# Patient Record
Sex: Female | Born: 1935 | Race: White | Hispanic: No | Marital: Married | Smoking: Never smoker
Health system: Southern US, Community
[De-identification: ages and names within clinical notes are randomized; demographics above are authoritative.]

## PROBLEM LIST (undated history)

## (undated) DIAGNOSIS — I639 Cerebral infarction, unspecified: Secondary | ICD-10-CM

## (undated) DIAGNOSIS — E785 Hyperlipidemia, unspecified: Secondary | ICD-10-CM

## (undated) DIAGNOSIS — O2230 Deep phlebothrombosis in pregnancy, unspecified trimester: Secondary | ICD-10-CM

## (undated) DIAGNOSIS — I10 Essential (primary) hypertension: Secondary | ICD-10-CM

## (undated) DIAGNOSIS — D6851 Activated protein C resistance: Secondary | ICD-10-CM

## (undated) HISTORY — PX: MENINGOCELE REPAIR: SHX712

## (undated) HISTORY — PX: MEDIAL PARTIAL KNEE REPLACEMENT: SHX5965

## (undated) HISTORY — PX: TUBAL LIGATION: SHX77

## (undated) HISTORY — PX: KNEE ARTHROSCOPY: SUR90

## (undated) HISTORY — PX: TONSILLECTOMY: SUR1361

## (undated) HISTORY — PX: APPENDECTOMY: SHX54

---

## 2018-08-13 ENCOUNTER — Encounter (HOSPITAL_BASED_OUTPATIENT_CLINIC_OR_DEPARTMENT_OTHER): Payer: Self-pay | Admitting: *Deleted

## 2018-08-13 ENCOUNTER — Other Ambulatory Visit: Payer: Self-pay

## 2018-08-13 ENCOUNTER — Emergency Department (HOSPITAL_BASED_OUTPATIENT_CLINIC_OR_DEPARTMENT_OTHER): Payer: Medicare Other

## 2018-08-13 ENCOUNTER — Emergency Department (HOSPITAL_BASED_OUTPATIENT_CLINIC_OR_DEPARTMENT_OTHER)
Admission: EM | Admit: 2018-08-13 | Discharge: 2018-08-13 | Disposition: A | Discharge status: Home / Self Care | Payer: Medicare Other | Attending: Emergency Medicine | Admitting: Emergency Medicine

## 2018-08-13 DIAGNOSIS — I10 Essential (primary) hypertension: Secondary | ICD-10-CM | POA: Insufficient documentation

## 2018-08-13 DIAGNOSIS — R200 Anesthesia of skin: Secondary | ICD-10-CM | POA: Diagnosis not present

## 2018-08-13 DIAGNOSIS — Z79899 Other long term (current) drug therapy: Secondary | ICD-10-CM | POA: Insufficient documentation

## 2018-08-13 HISTORY — DX: Deep phlebothrombosis in pregnancy, unspecified trimester: O22.30

## 2018-08-13 HISTORY — DX: Essential (primary) hypertension: I10

## 2018-08-13 HISTORY — DX: Hyperlipidemia, unspecified: E78.5

## 2018-08-13 HISTORY — DX: Cerebral infarction, unspecified: I63.9

## 2018-08-13 HISTORY — DX: Activated protein C resistance: D68.51

## 2018-08-13 LAB — URINALYSIS, ROUTINE W REFLEX MICROSCOPIC
Bilirubin Urine: NEGATIVE
Glucose, UA: NEGATIVE mg/dL
Hgb urine dipstick: NEGATIVE
Ketones, ur: NEGATIVE mg/dL
Leukocytes,Ua: NEGATIVE
Nitrite: NEGATIVE
Protein, ur: NEGATIVE mg/dL
Specific Gravity, Urine: 1.01 (ref 1.005–1.030)
pH: 6 (ref 5.0–8.0)

## 2018-08-13 LAB — CBC
HCT: 41.2 % (ref 36.0–46.0)
Hemoglobin: 13.1 g/dL (ref 12.0–15.0)
MCH: 29.7 pg (ref 26.0–34.0)
MCHC: 31.8 g/dL (ref 30.0–36.0)
MCV: 93.4 fL (ref 80.0–100.0)
Platelets: 255 10*3/uL (ref 150–400)
RBC: 4.41 MIL/uL (ref 3.87–5.11)
RDW: 13.1 % (ref 11.5–15.5)
WBC: 10 10*3/uL (ref 4.0–10.5)
nRBC: 0 % (ref 0.0–0.2)

## 2018-08-13 LAB — BASIC METABOLIC PANEL
Anion gap: 10 (ref 5–15)
BUN: 16 mg/dL (ref 8–23)
CO2: 24 mmol/L (ref 22–32)
Calcium: 9.7 mg/dL (ref 8.9–10.3)
Chloride: 104 mmol/L (ref 98–111)
Creatinine, Ser: 0.67 mg/dL (ref 0.44–1.00)
GFR calc Af Amer: >60 mL/min (ref >60)
GFR calc non Af Amer: >60 mL/min (ref >60)
Glucose, Bld: 112 mg/dL — ABNORMAL HIGH (ref 70–99)
Potassium: 4 mmol/L (ref 3.5–5.1)
Sodium: 138 mmol/L (ref 135–145)

## 2018-08-13 NOTE — ED Provider Notes (Signed)
TIME SEEN: 12:42 AM  CHIEF COMPLAINT: Hypertension  HPI: Patient is an 83 year old female with history of hypertension, hyperlipidemia, [facility], factor V Leiden mutation with previous DVT no longer on Coumadin who presents to the emergency department with complaints of high blood pressure.  He states that she felt "shaky".  She and her husband live in a independent living facility and they called the on-call nurse to come check her blood pressure.  They report it was in the 180s/100s.  She took her Norvasc 5 mg tonight.  Nurse recommended she come to the emergency department.  She states that she "felt aware" of the left side of her head today.  She does not describe this as a headache.  No head injury.  No vision changes.  No chest pain, shortness of breath, cough, fever.  Did have some left-sided deficits in her leg with her recent stroke.  She feels like the numbness has gotten slightly worse since discharge but no acute change today.  No other new numbness, weakness.  Able to ambulate.  ROS: See HPI Constitutional: no fever  Eyes: no drainage  ENT: no runny nose   Cardiovascular:  no chest pain  Resp: no SOB  GI: no vomiting GU: no dysuria Integumentary: no rash  Allergy: no hives  Musculoskeletal: no leg swelling  Neurological: no slurred speech ROS otherwise negative  PAST MEDICAL HISTORY/PAST SURGICAL HISTORY:  Past Medical History:  Diagnosis Date  . DVT (deep vein thrombosis) in pregnancy   . Factor V Leiden (HCC)   . Hyperlipidemia   . Hypertension   . Stroke [facility](HCC)     MEDICATIONS:  Prior to Admission medications   Medication Sig Start Date End Date Taking? Authorizing Provider  amLODipine (NORVASC) 5 MG tablet Take 5 mg by mouth daily.   Yes [provider]  atorvastatin (LIPITOR) 40 MG tablet Take 40 mg by mouth daily.   Yes [provider]    ALLERGIES:  No Known Allergies  SOCIAL HISTORY:  Social  History   Tobacco Use  . Smoking status: Never Smoker  . Smokeless tobacco: Never Used  Substance Use Topics  . Alcohol use: Not Currently    FAMILY HISTORY: History reviewed. No pertinent family history.  EXAM: BP (!) 156/92   Pulse 89   Temp 98.5 F (36.9 C) (Oral)   Resp 18   Ht 5' (1.524 m)   Wt 72.6 kg   SpO2 98%   BMI 31.25 kg/m  CONSTITUTIONAL: Alert and oriented and responds appropriately to questions. Well-appearing; well-nourished HEAD: Normocephalic EYES: Conjunctivae clear, pupils appear equal, EOMI ENT: normal nose; moist mucous membranes NECK: Supple, no meningismus, no nuchal rigidity, no LAD  CARD: RRR; S1 and S2 appreciated; no murmurs, no clicks, no rubs, no gallops RESP: Normal chest excursion without splinting or tachypnea; breath sounds clear and equal bilaterally; no wheezes, no rhonchi, no rales, no hypoxia or respiratory distress, speaking full sentences ABD/GI: Normal bowel sounds; non-distended; soft, non-tender, no rebound, no guarding, no peritoneal signs, no hepatosplenomegaly BACK:  The back appears normal and is non-tender to palpation, there is no CVA tenderness EXT: Normal ROM in all joints; non-tender to palpation; no edema; normal capillary refill; no cyanosis, no calf tenderness or swelling    SKIN: Normal color for age and race; warm; no rash NEURO: Moves all extremities equally, strength 5/5 in all 4 extremities, cranial nerves II through XII intact, normal speech, sensation to light touch  intact diffusely, no dysmetria to finger-to-nose testing bilaterally PSYCH: The patient's mood and manner are appropriate. Grooming and personal hygiene are appropriate.  MEDICAL DECISION MAKING: Patient here with complaints of high blood pressure and feeling abnormal in the left side of her head.  She denies that it was a pain.  She does report since discharge from the hospital on July 19 she feels like her left leg numbness has gotten worse but her neuro  exam today is normal and she feels normal sensation in all extremities that is equal.  No new focal neurologic deficit today.  Blood pressure currently in the 150s/80s.  Will check basic labs, urine today.  EKG shows no ischemic abnormality, arrhythmia.  Have offered head CT to look for any worsening intracranial hemorrhage given her complaints of feeling a uncomfortable feeling in the left side of her head today.  Patient and husband agree on head imaging.  I do not feel she needs MRI today if head CT shows no new acute abnormality.  ED PROGRESS: Patient's labs, urine and head CT show no acute abnormality.  She is in stable condition without complaints currently.  Blood pressure in the 130s/60s.  Have advised her to follow-up with her primary care doctor.  Has been has requested something for anxiety but I have recommended follow-up with PCP as we have discussed many medications have multiple side effects especially in elderly populations and will need to be monitored closely by her doctor.  Patient and family are comfortable with this plan.  Will discharge home.   At this time, I do not feel there is any life-threatening condition present. I have reviewed and discussed all results (EKG, imaging, lab, urine as appropriate) and exam findings with patient/family. I have reviewed nursing notes and appropriate previous records.  I feel the patient is safe to be discharged home without further emergent workup and can continue workup as an outpatient as needed. Discussed usual and customary return precautions. Patient/family verbalize understanding and are comfortable with this plan.  Outpatient follow-up has been provided as needed. All questions have been answered.      EKG Interpretation  Date/Time:  Tuesday August 13 2018 00:44:50 EDT Ventricular Rate:  84 PR Interval:    QRS Duration: 94 QT Interval:  379 QTC Calculation: 448 R Axis:   28 Text Interpretation:  Sinus rhythm No old tracing to  compare Confirmed by Ward, Cyril Mourning [phone]) on 08/13/2018 1:16:55 AM          Ward, Delice Bison, DO 08/13/18 0225

## 2018-08-13 NOTE — Discharge Instructions (Addendum)
Please continue your amlodipine as prescribed.  I recommend close follow-up with your primary care doctor.  Your labs, urine, EKG were normal today.  Your head CT showed no acute stroke or acute bleed today.

## 2018-08-13 NOTE — ED Notes (Signed)
Patient transported to CT 

## 2018-08-13 NOTE — ED Triage Notes (Signed)
Pt c/o " anxious and shaky" x 4 hrs increased bp 182/100

## 2020-08-03 IMAGING — CT CT HEAD WITHOUT CONTRAST
3 series · 14 of 47 positions shown, 16 images · non-contrast
Comparison: None.

CLINICAL DATA: Headache, left-sided.  Recent hemorrhagic stroke.

EXAM:
CT HEAD WITHOUT CONTRAST
TECHNIQUE: Contiguous axial images were obtained from the base of the skull
through the vertex without intravenous contrast.

[Series 2: head wo · axial · 0.41mm/px · z∈[-187,-62]mm · 8 of 31 slices shown, 10 images]
[im 3/31  brain]
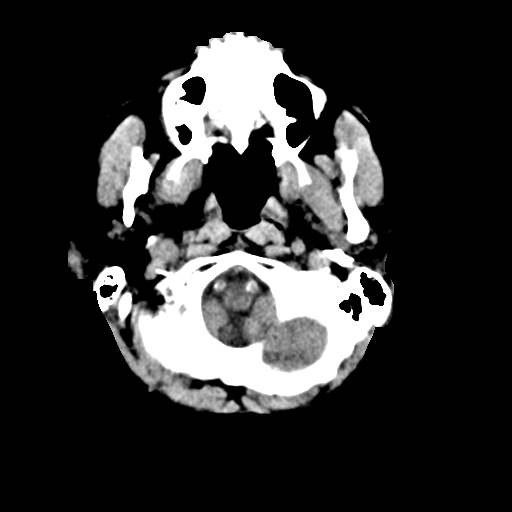
[im 3/31  bone]
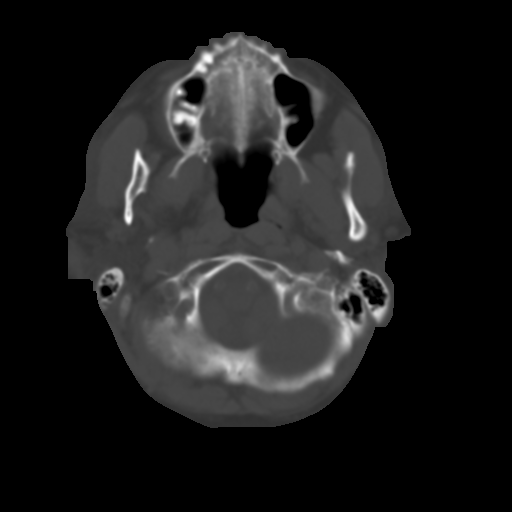
[im 7/31  brain]
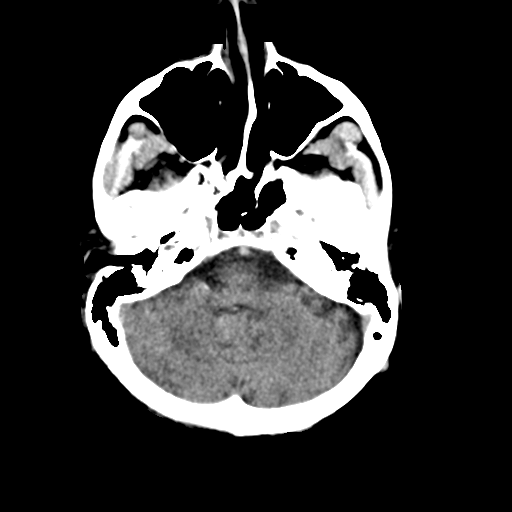
[im 10/31  brain]
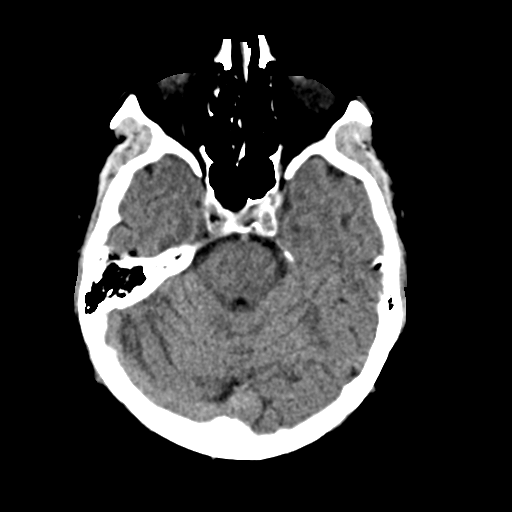
[im 14/31  brain]
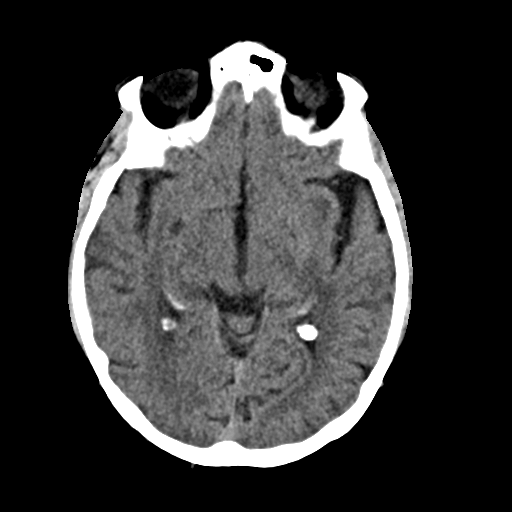
[im 17/31  brain]
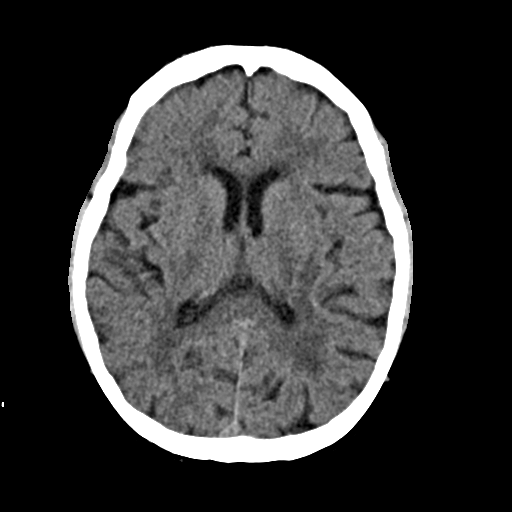
[im 17/31  bone]
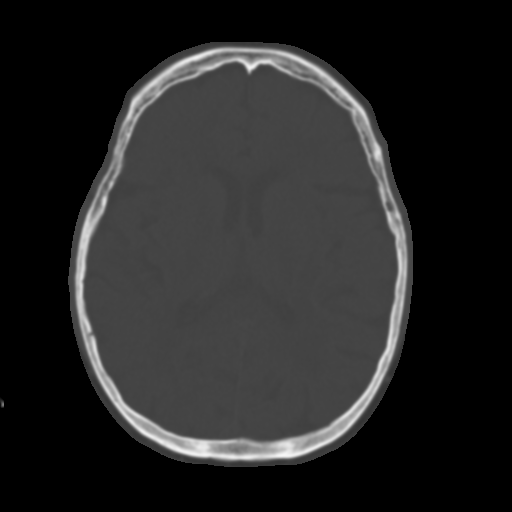
[im 21/31  brain]
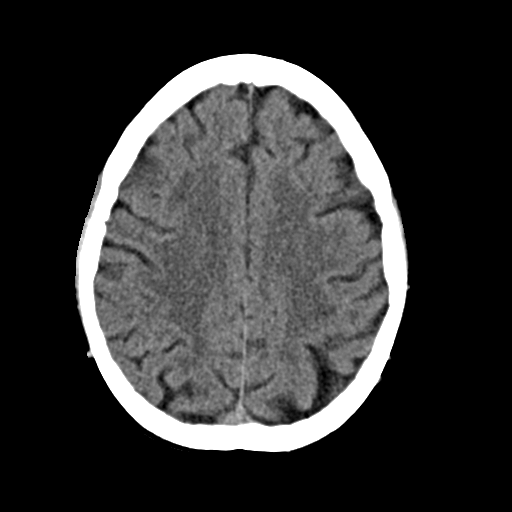
[im 24/31  brain]
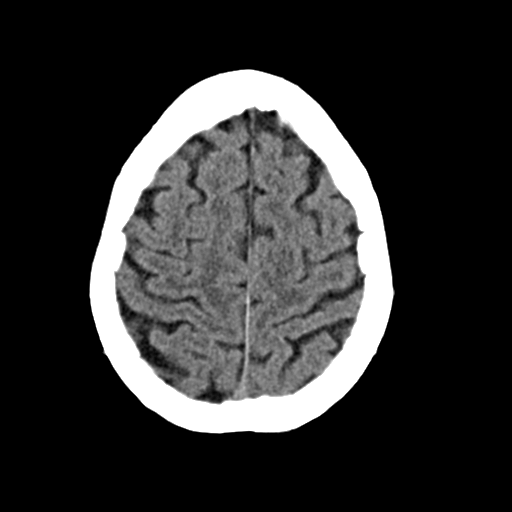
[im 28/31  brain]
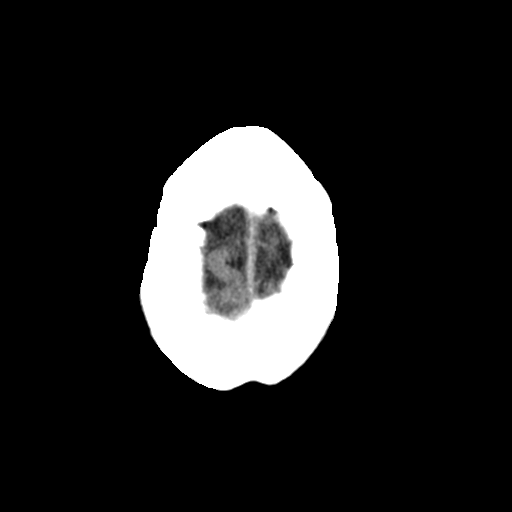

[Series 4: coronal soft · coronal · 0.32mm/px · 3 of 63 slices shown]
[im 21/63  brain]
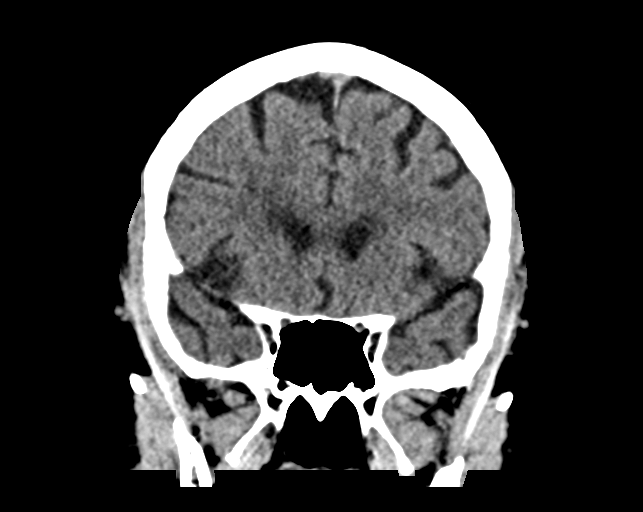
[im 28/63  brain]
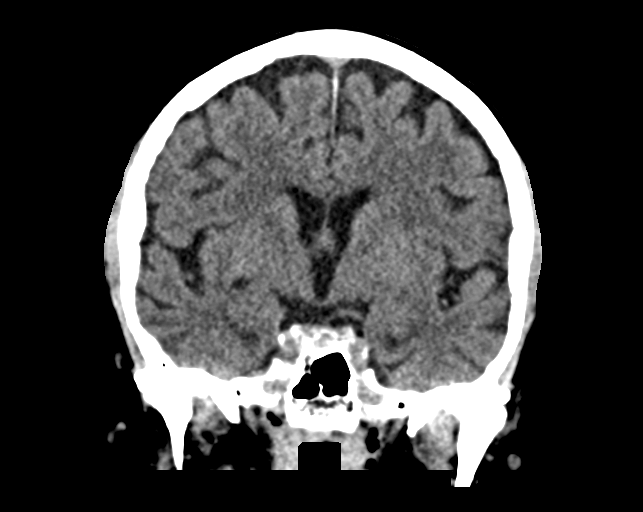
[im 35/63  brain]
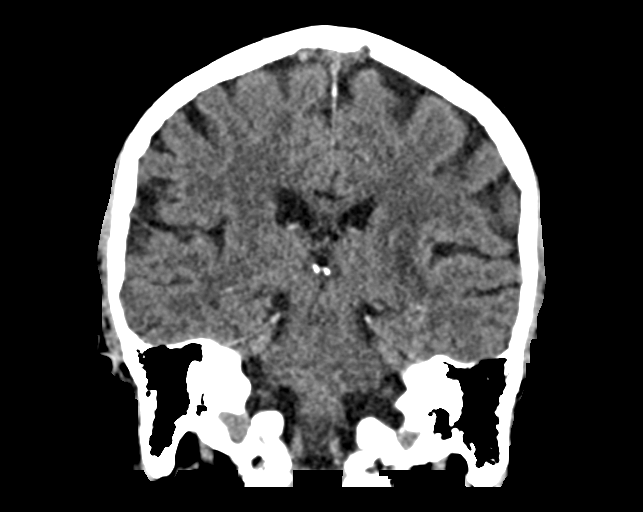

[Series 5: sag soft · sagittal · 0.31mm/px · 3 of 54 slices shown]
[im 18/54  brain]
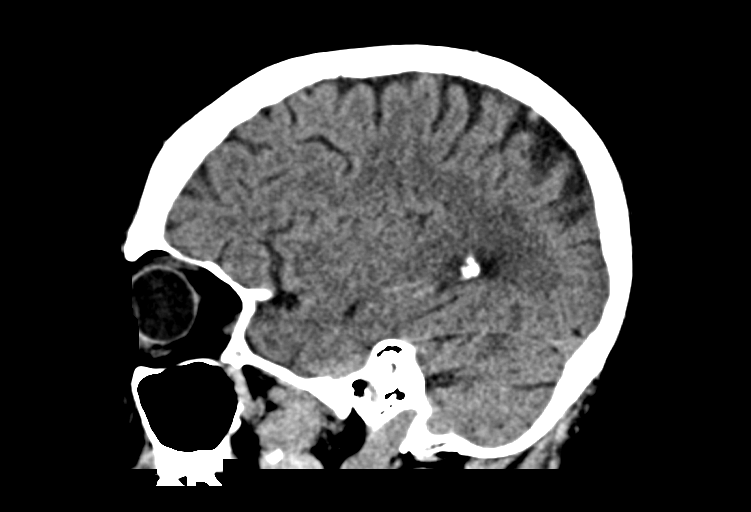
[im 27/54  brain]
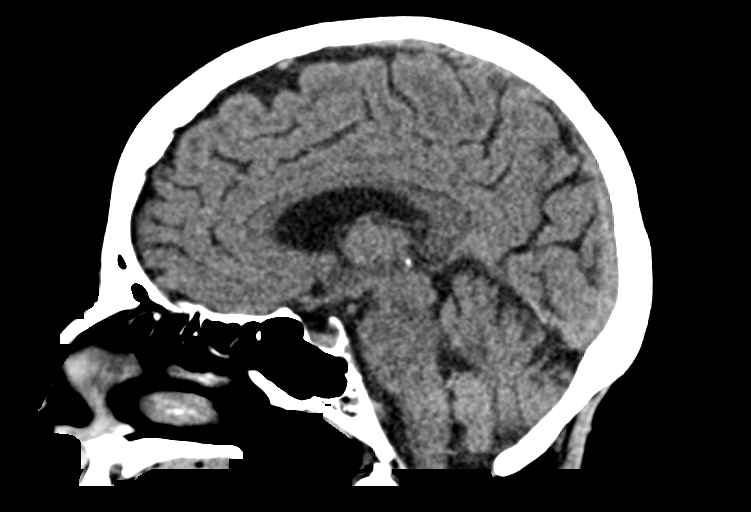
[im 36/54  brain]
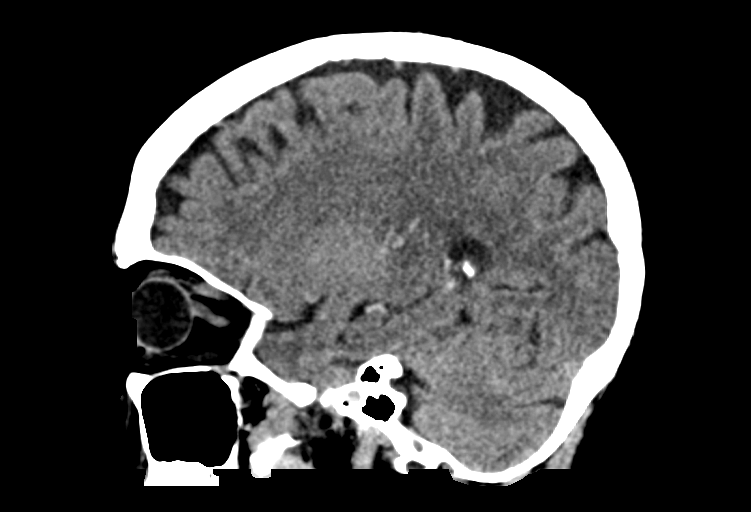

[14 of 47 positions shown; findings below may reference images not displayed]

FINDINGS: Brain: There is no mass, hemorrhage or extra-axial collection. The
size and configuration of the ventricles and extra-axial CSF spaces
are normal. There is hypoattenuation of the white matter, most
commonly indicating chronic small vessel disease. There are subacute
to chronic infarcts of both basal ganglia.

Vascular: No abnormal hyperdensity of the major intracranial
arteries or dural venous sinuses. No intracranial atherosclerosis.

Skull: The visualized skull base, calvarium and extracranial soft
tissues are normal.

Sinuses/Orbits: No fluid levels or advanced mucosal thickening of
the visualized paranasal sinuses. No mastoid or middle ear effusion.
The orbits are normal.
IMPRESSION: No intracranial hemorrhage. Subacute to chronic bilateral basal
ganglia small vessel infarcts.
